# Patient Record
Sex: Male | Born: 1945 | Race: White | Hispanic: No | Marital: Married | Smoking: Never smoker
Health system: Southern US, Community
[De-identification: ages and names within clinical notes are randomized; demographics above are authoritative.]

## PROBLEM LIST (undated history)

## (undated) DIAGNOSIS — F79 Unspecified intellectual disabilities: Secondary | ICD-10-CM

---

## 2002-01-22 ENCOUNTER — Emergency Department (HOSPITAL_COMMUNITY): Admission: EM | Admit: 2002-01-22 | Discharge: 2002-01-22 | Payer: Self-pay | Admitting: Emergency Medicine

## 2005-05-12 ENCOUNTER — Emergency Department (HOSPITAL_COMMUNITY): Admission: EM | Admit: 2005-05-12 | Discharge: 2005-05-13 | Payer: Self-pay | Admitting: Emergency Medicine

## 2008-11-04 ENCOUNTER — Emergency Department (HOSPITAL_COMMUNITY): Admission: EM | Admit: 2008-11-04 | Discharge: 2008-11-04 | Payer: Self-pay | Admitting: Emergency Medicine

## 2010-07-05 ENCOUNTER — Emergency Department (HOSPITAL_COMMUNITY): Admission: EM | Admit: 2010-07-05 | Discharge: 2010-07-06 | Payer: Self-pay | Admitting: Emergency Medicine

## 2010-12-22 LAB — PROTIME-INR: Prothrombin Time: 12.8 seconds (ref 11.6–15.2)

## 2010-12-22 LAB — CBC
HCT: 39.3 % (ref 39.0–52.0)
RDW: 12.1 % (ref 11.5–15.5)
WBC: 6.9 10*3/uL (ref 4.0–10.5)

## 2010-12-22 LAB — COMPREHENSIVE METABOLIC PANEL
ALT: 27 U/L (ref 0–53)
AST: 53 U/L — ABNORMAL HIGH (ref 0–37)
Albumin: 3.7 g/dL (ref 3.5–5.2)
Alkaline Phosphatase: 74 U/L (ref 39–117)
GFR calc Af Amer: >60 mL/min (ref >60)
Glucose, Bld: 169 mg/dL — ABNORMAL HIGH (ref 70–99)
Potassium: 3.4 mEq/L — ABNORMAL LOW (ref 3.5–5.1)
Sodium: 138 mEq/L (ref 135–145)
Total Protein: 6.6 g/dL (ref 6.0–8.3)

## 2010-12-22 LAB — POCT I-STAT, CHEM 8
BUN: 15 mg/dL (ref 6–23)
Chloride: 102 mEq/L (ref 96–112)
Creatinine, Ser: 1.2 mg/dL (ref 0.4–1.5)
Potassium: 3.4 mEq/L — ABNORMAL LOW (ref 3.5–5.1)
Sodium: 138 mEq/L (ref 135–145)

## 2013-01-03 ENCOUNTER — Encounter (HOSPITAL_COMMUNITY): Payer: Self-pay | Admitting: Emergency Medicine

## 2013-01-03 ENCOUNTER — Emergency Department (HOSPITAL_COMMUNITY): Payer: Medicare Other

## 2013-01-03 ENCOUNTER — Emergency Department (HOSPITAL_COMMUNITY)
Admission: EM | Admit: 2013-01-03 | Discharge: 2013-01-03 | Disposition: A | Discharge status: Home / Self Care | Payer: Medicare Other | Attending: Emergency Medicine | Admitting: Emergency Medicine

## 2013-01-03 DIAGNOSIS — R4789 Other speech disturbances: Secondary | ICD-10-CM | POA: Insufficient documentation

## 2013-01-03 DIAGNOSIS — Z79899 Other long term (current) drug therapy: Secondary | ICD-10-CM | POA: Insufficient documentation

## 2013-01-03 DIAGNOSIS — R51 Headache: Secondary | ICD-10-CM | POA: Insufficient documentation

## 2013-01-03 DIAGNOSIS — F29 Unspecified psychosis not due to a substance or known physiological condition: Secondary | ICD-10-CM | POA: Insufficient documentation

## 2013-01-03 LAB — BASIC METABOLIC PANEL
BUN: 19 mg/dL (ref 6–23)
Chloride: 102 mEq/L (ref 96–112)
Glucose, Bld: 117 mg/dL — ABNORMAL HIGH (ref 70–99)
Potassium: 4.2 mEq/L (ref 3.5–5.1)
Sodium: 138 mEq/L (ref 135–145)

## 2013-01-03 LAB — CBC WITH DIFFERENTIAL/PLATELET
Basophils Relative: 1 % (ref 0–1)
Eosinophils Absolute: 0 10*3/uL (ref 0.0–0.7)
HCT: 40.5 % (ref 39.0–52.0)
Hemoglobin: 13.6 g/dL (ref 13.0–17.0)
Lymphs Abs: 0.8 10*3/uL (ref 0.7–4.0)
MCH: 31 pg (ref 26.0–34.0)
MCHC: 33.6 g/dL (ref 30.0–36.0)
Monocytes Absolute: 0.7 10*3/uL (ref 0.1–1.0)
Monocytes Relative: 9 % (ref 3–12)
Neutro Abs: 6.5 10*3/uL (ref 1.7–7.7)
RBC: 4.39 MIL/uL (ref 4.22–5.81)

## 2013-01-03 LAB — PROTIME-INR: INR: 0.94 (ref 0.00–1.49)

## 2013-01-03 MED ORDER — FENTANYL CITRATE 0.05 MG/ML IJ SOLN
100.0000 ug | Freq: Once | INTRAMUSCULAR | Status: DC
  Filled 2013-01-03: qty 2

## 2013-01-03 MED ORDER — ONDANSETRON HCL 4 MG/2ML IJ SOLN
4.0000 mg | Freq: Once | INTRAMUSCULAR | Status: AC
  Administered 2013-01-03: 4 mg via INTRAVENOUS
  Filled 2013-01-03: qty 2

## 2013-01-03 MED ORDER — KETOROLAC TROMETHAMINE 30 MG/ML IJ SOLN
30.0000 mg | Freq: Once | INTRAMUSCULAR | Status: AC
  Administered 2013-01-03: 30 mg via INTRAVENOUS
  Filled 2013-01-03: qty 1

## 2013-01-03 MED ORDER — FENTANYL CITRATE 0.05 MG/ML IJ SOLN
50.0000 ug | Freq: Once | INTRAMUSCULAR | Status: AC
  Administered 2013-01-03: 50 ug via INTRAVENOUS

## 2013-01-03 NOTE — ED Notes (Signed)
Pt here for possible TIA, sts cold left side, and head pain, trouble speaking and confusion per son. Pt couldn't call 911 until he got home.

## 2013-01-03 NOTE — ED Notes (Signed)
Patient being transported to ct at this time.

## 2013-01-03 NOTE — ED Notes (Signed)
Pt here with c/o headache and HTN onset 1000 this morning. Initial BP 202/112 then 161/ 80, CBG 136, O2 98% on 2L. EMS given 250 mcg. Pt states has some relief.

## 2013-01-03 NOTE — ED Notes (Signed)
Pt desat to 85%, placed on 2L [address], O2 97%.

## 2013-01-03 NOTE — ED Notes (Signed)
Patient transported to CT 

## 2013-01-06 NOTE — ED Provider Notes (Signed)
History     CSN: [id number]  Arrival date & time 01/03/13  1330   First MD Initiated Contact with Patient 01/03/13 1414      Chief Complaint  Patient presents with  . Transient Ischemic Attack     HPI Pt here for possible TIA, sts cold left side, and head pain, trouble speaking and confusion per son. Pt couldn't call 911 until he got home.  History reviewed. No pertinent past medical history.  History reviewed. No pertinent past surgical history.  Family History  Problem Relation Age of Onset  . Hypertension Mother   . Diabetes Mother   . Hypertension Father   . Diabetes Father   . Seizures Brother     History  Substance Use Topics  . Smoking status: Never Smoker   . Smokeless tobacco: Not on file  . Alcohol Use: No      Review of Systems  Allergies  Review of patient's allergies indicates no known allergies.  Home Medications   Current Outpatient Rx  Name  Route  Sig  Dispense  Refill  . omeprazole (PRILOSEC OTC) 20 MG tablet   Oral   Take 20 mg by mouth daily.           BP 122/61  Pulse 57  Temp(Src) 98.5 F (36.9 C) (Oral)  Resp 18  SpO2 99%  Physical Exam  Nursing note and vitals reviewed. Constitutional: He is oriented to person, place, and time. He appears well-developed and well-nourished. No distress.  HENT:  Head: Normocephalic and atraumatic.  Eyes: Pupils are equal, round, and reactive to light.  Neck: Normal range of motion.  Cardiovascular: Normal rate and intact distal pulses.   Pulmonary/Chest: No respiratory distress.  Abdominal: Normal appearance. He exhibits no distension.  Musculoskeletal: Normal range of motion.  Neurological: He is alert and oriented to person, place, and time. He has normal strength. No cranial nerve deficit. Coordination normal. GCS eye subscore is 4. GCS verbal subscore is 5. GCS motor subscore is 6.  Skin: Skin is warm and dry. No rash noted.  Psychiatric: He has a normal mood and affect. His  behavior is normal.    ED Course  Procedures (including critical care time)  Labs Reviewed  BASIC METABOLIC PANEL - Abnormal; Notable for the following:    Glucose, Bld 117 (*)    GFR calc non Af Amer 58 (*)    GFR calc Af Amer 67 (*)    All other components within normal limits  CBC WITH DIFFERENTIAL - Abnormal; Notable for the following:    Neutrophils Relative 81 (*)    Lymphocytes Relative 10 (*)    All other components within normal limits  PROTIME-INR   No results found.  *RADIOLOGY REPORT*  Clinical Data: TIA. Headache.  CT HEAD WITHOUT CONTRAST  Technique: Contiguous axial images were obtained from the base of the skull through the vertex without contrast.  Comparison: CT 07/05/2010  Findings: Ventricle size is normal. Mild chronic microvascular ischemia in the white matter. No acute infarct. Negative for hemorrhage or mass. Calvarium is intact.  Small air-fluid level left maxillary sinus.  IMPRESSION: Mild chronic microvascular ischemia in the white matter. No acute intracranial abnormality.    1. Headache       MDM  Patient seems to be sing better.  According to family he has done this type of thing before.  They want to take him home. Patient back to baseline.  Without headache  Nelia Shi, MD 01/06/13 [phone]

## 2014-04-11 ENCOUNTER — Emergency Department (HOSPITAL_COMMUNITY): Payer: Medicare Other

## 2014-04-11 ENCOUNTER — Encounter (HOSPITAL_COMMUNITY): Payer: Self-pay | Admitting: Emergency Medicine

## 2014-04-11 ENCOUNTER — Inpatient Hospital Stay (HOSPITAL_COMMUNITY)
Admission: EM | Admit: 2014-04-11 | Discharge: 2014-05-09 | DRG: 064 | Disposition: E | Payer: Medicare Other | Attending: Pulmonary Disease | Admitting: Pulmonary Disease

## 2014-04-11 DIAGNOSIS — M6282 Rhabdomyolysis: Secondary | ICD-10-CM | POA: Diagnosis present

## 2014-04-11 DIAGNOSIS — N179 Acute kidney failure, unspecified: Secondary | ICD-10-CM | POA: Diagnosis present

## 2014-04-11 DIAGNOSIS — I6312 Cerebral infarction due to embolism of basilar artery: Secondary | ICD-10-CM

## 2014-04-11 DIAGNOSIS — R471 Dysarthria and anarthria: Secondary | ICD-10-CM | POA: Diagnosis present

## 2014-04-11 DIAGNOSIS — E872 Acidosis, unspecified: Secondary | ICD-10-CM | POA: Diagnosis present

## 2014-04-11 DIAGNOSIS — Z66 Do not resuscitate: Secondary | ICD-10-CM

## 2014-04-11 DIAGNOSIS — I6350 Cerebral infarction due to unspecified occlusion or stenosis of unspecified cerebral artery: Secondary | ICD-10-CM | POA: Diagnosis present

## 2014-04-11 DIAGNOSIS — L0201 Cutaneous abscess of face: Secondary | ICD-10-CM | POA: Diagnosis present

## 2014-04-11 DIAGNOSIS — L8990 Pressure ulcer of unspecified site, unspecified stage: Secondary | ICD-10-CM | POA: Diagnosis present

## 2014-04-11 DIAGNOSIS — F79 Unspecified intellectual disabilities: Secondary | ICD-10-CM | POA: Diagnosis present

## 2014-04-11 DIAGNOSIS — Z515 Encounter for palliative care: Secondary | ICD-10-CM | POA: Diagnosis not present

## 2014-04-11 DIAGNOSIS — L03211 Cellulitis of face: Secondary | ICD-10-CM

## 2014-04-11 DIAGNOSIS — I618 Other nontraumatic intracerebral hemorrhage: Secondary | ICD-10-CM

## 2014-04-11 DIAGNOSIS — L89899 Pressure ulcer of other site, unspecified stage: Secondary | ICD-10-CM | POA: Diagnosis present

## 2014-04-11 DIAGNOSIS — R404 Transient alteration of awareness: Secondary | ICD-10-CM | POA: Diagnosis present

## 2014-04-11 DIAGNOSIS — G9340 Encephalopathy, unspecified: Secondary | ICD-10-CM | POA: Diagnosis present

## 2014-04-11 DIAGNOSIS — I619 Nontraumatic intracerebral hemorrhage, unspecified: Secondary | ICD-10-CM | POA: Diagnosis present

## 2014-04-11 DIAGNOSIS — J9600 Acute respiratory failure, unspecified whether with hypoxia or hypercapnia: Secondary | ICD-10-CM | POA: Diagnosis present

## 2014-04-11 DIAGNOSIS — Z79899 Other long term (current) drug therapy: Secondary | ICD-10-CM | POA: Diagnosis not present

## 2014-04-11 DIAGNOSIS — I6322 Cerebral infarction due to unspecified occlusion or stenosis of basilar arteries: Secondary | ICD-10-CM

## 2014-04-11 DIAGNOSIS — R4189 Other symptoms and signs involving cognitive functions and awareness: Secondary | ICD-10-CM

## 2014-04-11 DIAGNOSIS — I214 Non-ST elevation (NSTEMI) myocardial infarction: Secondary | ICD-10-CM | POA: Diagnosis present

## 2014-04-11 DIAGNOSIS — K7200 Acute and subacute hepatic failure without coma: Secondary | ICD-10-CM | POA: Diagnosis present

## 2014-04-11 DIAGNOSIS — I613 Nontraumatic intracerebral hemorrhage in brain stem: Secondary | ICD-10-CM

## 2014-04-11 DIAGNOSIS — L0390 Cellulitis, unspecified: Secondary | ICD-10-CM | POA: Diagnosis present

## 2014-04-11 DIAGNOSIS — J9601 Acute respiratory failure with hypoxia: Secondary | ICD-10-CM

## 2014-04-11 DIAGNOSIS — E869 Volume depletion, unspecified: Secondary | ICD-10-CM | POA: Diagnosis present

## 2014-04-11 DIAGNOSIS — I639 Cerebral infarction, unspecified: Secondary | ICD-10-CM | POA: Diagnosis present

## 2014-04-11 DIAGNOSIS — I4891 Unspecified atrial fibrillation: Secondary | ICD-10-CM | POA: Diagnosis present

## 2014-04-11 DIAGNOSIS — IMO0002 Reserved for concepts with insufficient information to code with codable children: Secondary | ICD-10-CM

## 2014-04-11 HISTORY — DX: Unspecified intellectual disabilities: F79

## 2014-04-11 LAB — URINALYSIS, ROUTINE W REFLEX MICROSCOPIC
Bilirubin Urine: NEGATIVE
GLUCOSE, UA: NEGATIVE mg/dL
Ketones, ur: 15 mg/dL — AB
LEUKOCYTES UA: NEGATIVE
Nitrite: NEGATIVE
Protein, ur: 100 mg/dL — AB
SPECIFIC GRAVITY, URINE: 1.026 (ref 1.005–1.030)
Urobilinogen, UA: 0.2 mg/dL (ref 0.0–1.0)
pH: 5 (ref 5.0–8.0)

## 2014-04-11 LAB — CBC WITH DIFFERENTIAL/PLATELET
BASOS ABS: 0 10*3/uL (ref 0.0–0.1)
Basophils Relative: 0 % (ref 0–1)
EOS ABS: 0 10*3/uL (ref 0.0–0.7)
EOS PCT: 0 % (ref 0–5)
HCT: 47.5 % (ref 39.0–52.0)
Hemoglobin: 16.4 g/dL (ref 13.0–17.0)
Lymphocytes Relative: 13 % (ref 12–46)
Lymphs Abs: 1.4 10*3/uL (ref 0.7–4.0)
MCH: 32.8 pg (ref 26.0–34.0)
MCHC: 34.5 g/dL (ref 30.0–36.0)
MCV: 95 fL (ref 78.0–100.0)
Monocytes Absolute: 1.6 10*3/uL — ABNORMAL HIGH (ref 0.1–1.0)
Monocytes Relative: 15 % — ABNORMAL HIGH (ref 3–12)
NEUTROS PCT: 72 % (ref 43–77)
Neutro Abs: 7.8 10*3/uL — ABNORMAL HIGH (ref 1.7–7.7)
PLATELETS: 203 10*3/uL (ref 150–400)
RBC: 5 MIL/uL (ref 4.22–5.81)
RDW: 13.3 % (ref 11.5–15.5)
WBC: 10.8 10*3/uL — ABNORMAL HIGH (ref 4.0–10.5)

## 2014-04-11 LAB — COMPREHENSIVE METABOLIC PANEL
ALBUMIN: 2.7 g/dL — AB (ref 3.5–5.2)
ALT: 250 U/L — AB (ref 0–53)
AST: 696 U/L — AB (ref 0–37)
Alkaline Phosphatase: 76 U/L (ref 39–117)
Anion gap: 23 — ABNORMAL HIGH (ref 5–15)
BUN: 86 mg/dL — ABNORMAL HIGH (ref 6–23)
CALCIUM: 8.3 mg/dL — AB (ref 8.4–10.5)
CO2: 19 mEq/L (ref 19–32)
Chloride: 103 mEq/L (ref 96–112)
Creatinine, Ser: 3.03 mg/dL — ABNORMAL HIGH (ref 0.50–1.35)
GFR calc Af Amer: 23 mL/min — ABNORMAL LOW (ref >90)
GFR calc non Af Amer: 20 mL/min — ABNORMAL LOW (ref >90)
Glucose, Bld: 188 mg/dL — ABNORMAL HIGH (ref 70–99)
Potassium: 5 mEq/L (ref 3.7–5.3)
SODIUM: 145 meq/L (ref 137–147)
TOTAL PROTEIN: 6.4 g/dL (ref 6.0–8.3)
Total Bilirubin: 1.3 mg/dL — ABNORMAL HIGH (ref 0.3–1.2)

## 2014-04-11 LAB — I-STAT CG4 LACTIC ACID, ED: LACTIC ACID, VENOUS: 4.54 mmol/L — AB (ref 0.5–2.2)

## 2014-04-11 LAB — MRSA PCR SCREENING: MRSA by PCR: NEGATIVE

## 2014-04-11 LAB — CK: Total CK: 24486 U/L — ABNORMAL HIGH (ref 7–232)

## 2014-04-11 LAB — URINE MICROSCOPIC-ADD ON

## 2014-04-11 LAB — TROPONIN I: TROPONIN I: 0.3 ng/mL — AB (ref <0.30)

## 2014-04-11 LAB — GLUCOSE, CAPILLARY: GLUCOSE-CAPILLARY: 154 mg/dL — AB (ref 70–99)

## 2014-04-11 MED ORDER — VANCOMYCIN HCL IN DEXTROSE 1-5 GM/200ML-% IV SOLN
1000.0000 mg | INTRAVENOUS | Status: DC
  Administered 2014-04-11: 1000 mg via INTRAVENOUS
  Filled 2014-04-11: qty 200

## 2014-04-11 MED ORDER — SODIUM CHLORIDE 0.9 % IV SOLN: 250.0000 mL | INTRAVENOUS | Status: DC | PRN

## 2014-04-11 MED ORDER — FENTANYL BOLUS VIA INFUSION
25.0000 ug | INTRAVENOUS | Status: DC | PRN
  Administered 2014-04-11 (×3): 50 ug via INTRAVENOUS
  Filled 2014-04-11: qty 50

## 2014-04-11 MED ORDER — SODIUM CHLORIDE 0.9 % IV BOLUS (SEPSIS)
1000.0000 mL | Freq: Once | INTRAVENOUS | Status: AC
  Administered 2014-04-11: 1000 mL via INTRAVENOUS

## 2014-04-11 MED ORDER — MORPHINE BOLUS VIA INFUSION: 5.0000 mg | INTRAVENOUS | Status: DC | PRN

## 2014-04-11 MED ORDER — MORPHINE SULFATE 4 MG/ML IJ SOLN: 4.0000 mg | INTRAMUSCULAR | Status: DC | PRN

## 2014-04-11 MED ORDER — LORAZEPAM BOLUS VIA INFUSION
2.0000 mg | INTRAVENOUS | Status: DC | PRN
  Filled 2014-04-11: qty 5

## 2014-04-11 MED ORDER — PANTOPRAZOLE SODIUM 40 MG IV SOLR
40.0000 mg | Freq: Every day | INTRAVENOUS | Status: DC
  Filled 2014-04-11: qty 40

## 2014-04-11 MED ORDER — FENTANYL CITRATE 0.05 MG/ML IJ SOLN: 50.0000 ug | Freq: Once | INTRAMUSCULAR | Status: AC

## 2014-04-11 MED ORDER — MORPHINE SULFATE 10 MG/ML IJ SOLN: 10.0000 mg/h | INTRAVENOUS | Status: DC

## 2014-04-11 MED ORDER — SODIUM CHLORIDE 0.9 % IV SOLN
0.0000 ug/h | INTRAVENOUS | Status: DC
  Administered 2014-04-11: 100 ug/h via INTRAVENOUS
  Filled 2014-04-11: qty 50

## 2014-04-11 MED ORDER — METOPROLOL TARTRATE 1 MG/ML IV SOLN: 5.0000 mg | Freq: Four times a day (QID) | INTRAVENOUS | Status: DC

## 2014-04-11 MED ORDER — DEXTROSE-NACL 5-0.45 % IV SOLN
INTRAVENOUS | Status: DC
Start: 2014-04-11 — End: 2014-04-11

## 2014-04-11 MED ORDER — SODIUM CHLORIDE 0.9 % IV SOLN
4.0000 mg/h | INTRAVENOUS | Status: DC
  Administered 2014-04-11: 4 mg/h via INTRAVENOUS
  Filled 2014-04-11: qty 10

## 2014-04-11 MED ORDER — LORAZEPAM 2 MG/ML IJ SOLN
5.0000 mg/h | INTRAMUSCULAR | Status: DC
  Filled 2014-04-11: qty 25

## 2014-04-11 NOTE — ED Notes (Signed)
Pt transported to CT with RT and this RN.   

## 2014-04-11 NOTE — Progress Notes (Signed)
Wasted 100mL Fentanyl and 80mL Morphine after patient expired.  Delsa Saleyril, RN witnessed

## 2014-04-11 NOTE — ED Notes (Addendum)
Portable xray at bedside.   RN requested sedation for patient. MD sts to hold for now.

## 2014-04-11 NOTE — ED Notes (Signed)
Dr. Reynolds at bedside.

## 2014-04-11 NOTE — Progress Notes (Signed)
ANTIBIOTIC CONSULT NOTE - INITIAL  Pharmacy Consult for vancomycin Indication: cellulitis  No Known Allergies  Patient Measurements: Wt= 57.1kg  Vital Signs: Temp: 96.8 F (36 C) (07/04 1445) Temp src: Temporal (07/04 1112) BP: 131/78 mmHg (07/04 1445) Pulse Rate: 68 (07/04 1529) Intake/Output from previous day:   Intake/Output from this shift: Total I/O In: 3000 [I.V.:3000] Out: 1000 [Urine:1000]  Labs:  Recent Labs  Jan 20, 2014 1127  WBC 10.8*  HGB 16.4  PLT 203  CREATININE 3.03*   The CrCl is unknown because both a height and weight (above a minimum accepted value) are required for this calculation. No results found for this basename: VANCOTROUGH, VANCOPEAK, VANCORANDOM, GENTTROUGH, GENTPEAK, GENTRANDOM, TOBRATROUGH, TOBRAPEAK, TOBRARND, AMIKACINPEAK, AMIKACINTROU, AMIKACIN,  in the last 72 hours   Microbiology: No results found for this or any previous visit (from the past 720 hour(s)).  Medical History: Past Medical History  Diagnosis Date  . Mental retardation     Medications:  Prescriptions prior to admission  Medication Sig Dispense Refill  . omeprazole (PRILOSEC OTC) 20 MG tablet Take 20 mg by mouth daily.       Scheduled:  . fentaNYL  50 mcg Intravenous Once  . metoprolol  5 mg Intravenous 4 times per day  . pantoprazole (PROTONIX) IV  40 mg Intravenous QHS    Assessment: 68 yo male here with stroke with hemorrhagic conversion and to begin vancomycin for facial cellulitis.  WBC= 1.8, SCr= 3.03, CrCl ~25 (SCr noted 1.28 12/2012).  7/4 vanc>>  Goal of Therapy:  Vancomycin trough level 10-15 mcg/ml  Plan:  -Vancomycin 1000mg  IV q48hr -Will follow renal function and clinical progress  Harland Germanndrew Saxton Chain, Pharm D 05/06/2014 3:41 PM

## 2014-04-11 NOTE — Progress Notes (Signed)
[facility]  Discussed situation with Mr. Kevin Herrera's family at length today.  They understand the severity of this illness and the degree of brain injury.  They understand that he will not survive this illness.  We discussed our current level of support and I recommended that we withdraw the ventilator and let him pass away peacefully on a fentanyl drip.  They agree with this plan.  Withdrawal of care order set written.  Kevin [name], MD [place] [facility] Pager: [phone] Cell: [phone] If no response, call [phone]

## 2014-04-11 NOTE — Progress Notes (Signed)
eLink Physician-Brief Progress Note Patient Name: Kevin Herrera DOB: 06/12/1946 MRN: [id number]  Date of Service  04/29/2014   HPI/Events of Note   Family ready for withdrawal of all care   eICU Interventions  See orders   Intervention Category Major Interventions: End of life / care limitation discussion  Shan Levansatrick Wright 05/08/2014, 8:02 PM

## 2014-04-11 NOTE — ED Notes (Signed)
Results of lactic acid given to Dr. Loretha [place]ord [place]

## 2014-04-11 NOTE — Progress Notes (Signed)
Pt. Extubated as per MD order and protocol. Family and RN at pt. Bedside.

## 2014-04-11 NOTE — Progress Notes (Signed)
Time of death: 2159.  Pola CornELINK MD Dr Delford FieldWright called and made aware of time of death.  [facility] called. Referral number is [id number]. Per [facility] pt is suitable for tissue donation.

## 2014-04-11 NOTE — ED Notes (Signed)
Lab called for CK add on

## 2014-04-11 NOTE — H&P (Addendum)
PULMONARY / CRITICAL CARE MEDICINE   Name: Kevin Herrera MRN: [id number] DOB: 03/30/1946    ADMISSION DATE:  04/27/2014 CONSULTATION DATE:  05/07/2014  REFERRING MD :  Nori RiisEDP, [facility] PRIMARY SERVICE: PCCM  CHIEF COMPLAINT:  Found down  BRIEF PATIENT DESCRIPTION: 68 y/o male with minimal past medical history [facility] after [facility] was found down at home.  He was found to have a midbrain stroke with hemorrhagic conversion in the ED.  SIGNIFICANT EVENTS / STUDIES:  7/4 CT head/c-spine/maxillofacial > 2.3x1.2x1.8 cm hemorrhagic infacrt in upper pons and lower midbrain, evidence for additional nonhemorrhagic infarct, multiple acute and subacute nonhemorrhagic infarcts in cerebellum and thalami, atrophy and white matter change in anterior circulation, soft tissue swelling R scalp and face, spondylosis of c-spine  LINES / TUBES: 7/4 ETT >>  CULTURES:   ANTIBIOTICS:   HISTORY OF PRESENT ILLNESS:  68 y/o male with minimal past medical history [facility] he was found down at home.  He was found to have a midbrain stroke with hemorrhagic conversion in the ED.  The patient was intubated in the ED and no further history could be obtained aside from talking to the ED providers and the patient's family.  The patient was last seen normal on Thursday July 2 afternoon when he was playing with children near his home.  No one heard from him on July 3 so family went to check on him on July 4.  He was found by his brother face down under his bed breathing spontaneously but unresponsive.  [facility] who intubated the patient in route.    PAST MEDICAL HISTORY :  Past Medical History  Diagnosis Date  . Mental retardation    No past surgical history on file. Prior to Admission medications   Medication Sig Start Date End Date Taking? Authorizing Provider  omeprazole (PRILOSEC OTC) 20 MG tablet Take 20 mg by mouth daily.    Yes Historical Provider, MD   No Known Allergies  FAMILY HISTORY/SOCIAL HISTORY/REVIEW OF SYSTEMS:  Could not obtain due to intubation  SUBJECTIVE:   VITAL SIGNS: Temp:  [95.7 F (35.4 C)-98.8 F (37.1 C)] 95.7 F (35.4 C) (07/04 1330) Pulse Rate:  [70-160] 133 (07/04 1330) Resp:  [14-28] 23 (07/04 1315) BP: (109-155)/(65-92) 131/74 mmHg (07/04 1330) SpO2:  [97 %-100 %] 99 % (07/04 1330) FiO2 (%):  [100 %] 100 % (07/04 1125) HEMODYNAMICS:   VENTILATOR SETTINGS: Vent Mode:  [-] PRVC FiO2 (%):  [100 %] 100 % Set Rate:  [16 bmp] 16 bmp Vt Set:  [530 mL] 530 mL PEEP:  [5 cmH20] 5 cmH20 Plateau Pressure:  [24 cmH20] 24 cmH20 INTAKE / OUTPUT: Intake/Output     07/03 0701 - 07/04 0700 07/04 0701 - 07/05 0700   I.V.  3000   Total Intake   3000   Urine  1000   Total Output   1000   Net   +2000          PHYSICAL EXAMINATION:  Gen: elderly white male, multiple pressure wounds, chronically ill appearing on vent HEENT: Right sided pressure wound/ulcer over R zygomatic process, R eyelid erythematous, fluctuant, Left eye pupil round, not reactive to light, ETT in place, hard collar in place PULM: rhonchi bilaterally, good air movement CV: Irreg irreg, no murmur AB: BS+, soft, nontender, no hsm Ext: warm, no edema, no clubbing, no cyanosis (see derm)  Derm: multiple skin ulcerations: large chest wall pressure ulcer, bilateral knee pressure ulcers L > R Neuro: non-arousable on vent, non-purposeful movements L arm/leg  LABS:  CBC  Recent Labs Lab November 13, 2013 1127  WBC 10.8*  HGB 16.4  HCT 47.5  PLT 203   Coag's No results found for this basename: APTT, INR,  in the last 168 hours BMET  Recent Labs Lab November 13, 2013 1127  NA 145  K 5.0  CL 103  CO2 19  BUN 86*  CREATININE 3.03*  GLUCOSE 188*   Electrolytes  Recent Labs Lab November 13, 2013 1127  CALCIUM 8.3*   Sepsis Markers  Recent Labs Lab November 13, 2013 1137  LATICACIDVEN 4.54*   ABG No results found for this  basename: PHART, PCO2ART, PO2ART,  in the last 168 hours Liver Enzymes  Recent Labs Lab November 13, 2013 1127  AST 696*  ALT 250*  ALKPHOS 76  BILITOT 1.3*  ALBUMIN 2.7*   Cardiac Enzymes  Recent Labs Lab November 13, 2013 1127  TROPONINI 0.30*   Glucose No results found for this basename: GLUCAP,  in the last 168 hours  Imaging   CXR: hyperexpanded lungs, ETT in place  ASSESSMENT / PLAN:  NEUROLOGIC A:   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 Acute encephalopathy due to the stroke, very poor prognosis P:   RASS goal: -1 -per neurology  PULMONARY A: Acute respiratory failure due to inability to protect airway from CVA P:   -full vent support for now -ABG -CXR in AM  CARDIOVASCULAR A: Afib with RVR NSTEMI P:  -start with metoprolol for rate control -tele -hold anticoagulation with head bleed -no ASA given with head  RENAL A:  AKI from volume depletion Lactic acidosis from prolonged down time P:   -D5 with 1/2 NS -monitor BMET  GASTROINTESTINAL A:  Shock Liver P:   -monitor LFTs -Pantoprazole for stress ulcer prophylaxis  HEMATOLOGIC A:  No acute issues P:  -SCD's for DVT prophylaxis  INFECTIOUS A:  Cellulitis on face P:   -vanc -wound care  ENDOCRINE A:  No acute issues P:   -monitor glucose   Code status: DNR, will address withdrawal with family when all family here. This is a non-survivable stroke and suspect family wants to withdraw care.   I have personally obtained a history, examined the patient, evaluated laboratory and imaging results, formulated the assessment and plan and placed orders. CRITICAL CARE: The patient is critically ill with multiple organ systems failure and requires high complexity decision making for assessment and support, frequent evaluation and titration of therapies, application of advanced monitoring technologies and extensive interpretation of multiple databases. Critical Care Time devoted to  patient care services described in this note is 45 minutes.   Heber CarolinaBrent Taqwa Deem, MD [place] [facility] Pager: [phone] Cell: [phone] If no response, call [phone]   04/11/2014, 2:15 PM

## 2014-04-11 NOTE — ED Notes (Signed)
Chaplain notified 

## 2014-04-11 NOTE — Consult Note (Signed)
Referring Physician: Loretha Stapler    Chief Complaint: Unresponsive  HPI: Kevin Herrera is an 68 y.o. male who lives alone and was last seen by neighbors on Thursday.  Did not speak to family members yesterday and today when his nephew found him he was on the ground unclothed.  EMS was called at that time and the patient was brought to the ED for evaluation.  Patient was intubated in the ED.   At baseline patient mentally challenged but able to live alone and work.  Was unable to read and write.  Speech was markedly dysarthric.    Date last known well: Date: 04/09/2014 Time last known well: Unable to determine tPA Given: No: Hemorrhage noted on imaging, outside time window  Past Medical History  Diagnosis Date  . Mental retardation     No past surgical history on file.  Family History  Problem Relation Age of Onset  . Hypertension Mother   . Diabetes Mother   . Hypertension Father   . Diabetes Father   . Seizures Brother    Social History:  reports that he has never smoked. He does not have any smokeless tobacco history on file. He reports that he does not drink alcohol. His drug history is not on file.  Allergies: No Known Allergies  Medications: I have reviewed the patient's current medications. Prior to Admission:  Current outpatient prescriptions:omeprazole (PRILOSEC OTC) 20 MG tablet, Take 20 mg by mouth daily., Disp: , Rfl:   ROS: Unable to provide due to mental status  Physical Examination: Blood pressure 128/82, pulse 76, temperature 96.6 F (35.9 C), temperature source Temporal, resp. rate 24, height 5\' 7"  (1.702 m), SpO2 97.00%.  Neurologic Examination: Mental Status: Patient does not respond to verbal stimuli.  Does not respond to deep sternal rub.  Does not follow commands.  No verbalizations noted.  Cranial Nerves: II: patient does not respond confrontation bilaterally, left pupil 4mm and unreactive, right pupil unable to be visualized (bruising around the eye  and puss coming from eye on manipulation III,IV,VI: doll's response absent on the left.  V,VII: corneal reflex absent on the left  VIII: patient does not respond to verbal stimuli IX,X: gag reflex reduced, XI: trapezius strength unable to test bilaterally XII: tongue strength unable to test Motor: Patient has spontaneous movement of the left upper extremity and left lower extremity as well as movement in these extremities with stimulation.  Increased tone in the RUE with some minimal withdrawal to noxious stimuli.  No movement noted in the RLE.   Sensory: Some triple reflex responses noted with noxious stimuli in the extremities.  Deep Tendon Reflexes:  1+ in the upper extremities.  Unable to perform at the knees due to skin/muscle breakdown.  Absent AJ's bilaterally Plantars: mute bilaterally Cerebellar: Unable to perform    Laboratory Studies:  Basic Metabolic Panel:  Recent Labs Lab 2014-04-23 1127  NA 145  K 5.0  CL 103  CO2 19  GLUCOSE 188*  BUN 86*  CREATININE 3.03*  CALCIUM 8.3*    Liver Function Tests:  Recent Labs Lab 23-Apr-2014 1127  AST 696*  ALT 250*  ALKPHOS 76  BILITOT 1.3*  PROT 6.4  ALBUMIN 2.7*   No results found for this basename: LIPASE, AMYLASE,  in the last 168 hours No results found for this basename: AMMONIA,  in the last 168 hours  CBC:  Recent Labs Lab 23-Apr-2014 1127  WBC 10.8*  NEUTROABS 7.8*  HGB 16.4  HCT 47.5  MCV 95.0  PLT 203    Cardiac Enzymes:  Recent Labs Lab 05/06/2014 1127  TROPONINI 0.30*    BNP: No components found with this basename: POCBNP,   CBG: No results found for this basename: GLUCAP,  in the last 168 hours  Microbiology: No results found for this or any previous visit.  Coagulation Studies: No results found for this basename: LABPROT, INR,  in the last 72 hours  Urinalysis:  Recent Labs Lab 05/08/2014 1137  COLORURINE AMBER*  LABSPEC 1.026  PHURINE 5.0  GLUCOSEU NEGATIVE  HGBUR LARGE*   BILIRUBINUR NEGATIVE  KETONESUR 15*  PROTEINUR 100*  UROBILINOGEN 0.2  NITRITE NEGATIVE  LEUKOCYTESUR NEGATIVE    Lipid Panel: No results found for this basename: chol, trig, hdl, cholhdl, vldl, ldlcalc    HgbA1C:  No results found for this basename: HGBA1C    Urine Drug Screen:   No results found for this basename: labopia, cocainscrnur, labbenz, amphetmu, thcu, labbarb    Alcohol Level: No results found for this basename: ETH,  in the last 168 hours  Other results: EKG: tachycardia at 166 bpm.  Imaging: Ct Head Wo Contrast  04/20/2014   CLINICAL DATA:  Patient found down unresponsive. Contusion to back of head. Lasting normal several days ago.  EXAM: CT HEAD WITHOUT CONTRAST  CT MAXILLOFACIAL WITHOUT CONTRAST  CT CERVICAL SPINE WITHOUT CONTRAST  TECHNIQUE: Multidetector CT imaging of the head, cervical spine, and maxillofacial structures were performed using the standard protocol without intravenous contrast. Multiplanar CT image reconstructions of the cervical spine and maxillofacial structures were also generated.  COMPARISON:  CT head without contrast 01/03/2013. CT cervical spine 07/05/2010  FINDINGS: CT HEAD FINDINGS  Patient's intubated. A brainstem hemorrhage is evident within the pons posteriorly and to the right measuring at least 2.3 x 1.2 x 1.8cm. mm there is marked hypoattenuation in the midbrain above the hemorrhage suggesting infarct. Multiple patchy areas of hypoattenuation are present within the cerebellar hemispheres bilaterally, more prominent left than right. No additional hemorrhage is present. 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  A fluid level is present in the right frontal sinus. There scattered opacification of ethmoid air cells. Circumferential mucosal thickening in some fluid is present  in the right maxillary sinus. Mild mucosal thickening is present in the right sphenoid sinus. There is fluid in the left mastoid air cells. The right mastoid air cells are clear. Extensive edema is present over the right side of the head including periorbital edema. This extends into the face.  CT MAXILLOFACIAL FINDINGS  Extensive soft tissue swelling is present over the right side of skull and face. There is no discrete abscess. Asymmetric mucosal disease in fluid is present within the paranasal sinuses. The patient is intubated. Extensive periorbital edema is present. The globe and orbit are intact.  Patient is edentulous.  The mandible is intact and located.  CT CERVICAL SPINE FINDINGS  The cervical spine is imaged from the skullbase through T3. There is significant loss of disc height and endplate osteophyte formation throughout the cervical spine. This is generally worse on the right. No acute fracture or traumatic subluxation is evident. The lung apices are clear. The soft tissues of the neck are unremarkable. The endotracheal tube is in satisfactory position.  IMPRESSION: 1. 2.3 x 1.2 x 1.8 cm hemorrhagic infarct within the upper pons and lower midbrain. 2. Extensive  hypoattenuation in the midbrain superior to this area compatible with edema or additional nonhemorrhagic infarct. 3. Multiple acute and subacute nonhemorrhagic infarcts involving the cerebellum and thalami bilaterally as described. These represent multiple vascular lesions within the posterior circulation territory. 4. Atrophy and white matter disease within the anterior circulation territories is within normal limits for age. 5. Extensive soft tissue swelling over the right side of the scalp and face. There is no underlying abscess or osseous lesion. This may represent positional edema if the patient was down for an extended. 6. Moderate spondylosis of the cervical spine. No acute abnormality of spine. Critical Value/emergent results were called  by telephone at the time of interpretation on 04/22/2014 at 12:24 PM to Dr. Blake DivineJOHN WOFFORD , who verbally acknowledged these results.   Electronically Signed   By: Gennette Pachris  Mattern M.D.   On: 04/30/2014 12:30   Ct Cervical Spine Wo Contrast  04/15/2014   CLINICAL DATA:  Patient found down unresponsive. Contusion to back of head. Lasting normal several days ago.  EXAM: CT HEAD WITHOUT CONTRAST  CT MAXILLOFACIAL WITHOUT CONTRAST  CT CERVICAL SPINE WITHOUT CONTRAST  TECHNIQUE: Multidetector CT imaging of the head, cervical spine, and maxillofacial structures were performed using the standard protocol without intravenous contrast. Multiplanar CT image reconstructions of the cervical spine and maxillofacial structures were also generated.  COMPARISON:  CT head without contrast 01/03/2013. CT cervical spine 07/05/2010  FINDINGS: CT HEAD FINDINGS  Patient's intubated. A brainstem hemorrhage is evident within the pons posteriorly and to the right measuring at least 2.3 x 1.2 x 1.8cm. mm there is marked hypoattenuation in the midbrain above the hemorrhage suggesting infarct. Multiple patchy areas of hypoattenuation are present within the cerebellar hemispheres bilaterally, more prominent left than right. No additional hemorrhage is present. 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  A fluid level is present in the right frontal sinus. There scattered opacification of ethmoid air cells. Circumferential mucosal thickening in some fluid is present in the right maxillary sinus. Mild mucosal thickening is present in the right sphenoid sinus. There is fluid in the left mastoid air cells. The right mastoid air cells are clear. Extensive edema is present over the right side of the head including periorbital edema. This extends into the face.  CT MAXILLOFACIAL FINDINGS   Extensive soft tissue swelling is present over the right side of skull and face. There is no discrete abscess. Asymmetric mucosal disease in fluid is present within the paranasal sinuses. The patient is intubated. Extensive periorbital edema is present. The globe and orbit are intact.  Patient is edentulous.  The mandible is intact and located.  CT CERVICAL SPINE FINDINGS  The cervical spine is imaged from the skullbase through T3. There is significant loss of disc height and endplate osteophyte formation throughout the cervical spine. This is generally worse on the right. No acute fracture or traumatic subluxation is evident. The lung apices are clear. The soft tissues of the neck are unremarkable. The endotracheal tube is in satisfactory position.  IMPRESSION: 1. 2.3 x 1.2 x 1.8 cm hemorrhagic infarct within the upper pons and lower midbrain. 2. Extensive hypoattenuation in the midbrain superior to this area compatible with edema or additional nonhemorrhagic infarct. 3. Multiple acute and subacute nonhemorrhagic infarcts involving the cerebellum and thalami bilaterally as described. These represent multiple vascular lesions within the posterior circulation territory. 4. Atrophy  and white matter disease within the anterior circulation territories is within normal limits for age. 5. Extensive soft tissue swelling over the right side of the scalp and face. There is no underlying abscess or osseous lesion. This may represent positional edema if the patient was down for an extended. 6. Moderate spondylosis of the cervical spine. No acute abnormality of spine. Critical Value/emergent results were called by telephone at the time of interpretation on 04/29/2014 at 12:24 PM to Dr. Blake Divine , who verbally acknowledged these results.   Electronically Signed   By: Gennette Pac M.D.   On: 04/14/2014 12:30   Dg Chest Port 1 View  04/26/2014   CLINICAL DATA:  unresponsive  EXAM: PORTABLE CHEST - 1 VIEW  COMPARISON:  CT  Chest abdomen pelvis 07/05/2010.  FINDINGS: The heart size and mediastinal contours are within normal limits. 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 Both lungs are otherwise clear. The lungs are hyperinflated. The visualized skeletal structures are unremarkable.  IMPRESSION: COPD.  No acute cardiopulmonary disease.   Electronically Signed   By: Salome Holmes M.D.   On: 04/21/2014 12:18   Ct Maxillofacial Wo Cm  04/11/2014   CLINICAL DATA:  Patient found down unresponsive. Contusion to back of head. Lasting normal several days ago.  EXAM: CT HEAD WITHOUT CONTRAST  CT MAXILLOFACIAL WITHOUT CONTRAST  CT CERVICAL SPINE WITHOUT CONTRAST  TECHNIQUE: Multidetector CT imaging of the head, cervical spine, and maxillofacial structures were performed using the standard protocol without intravenous contrast. Multiplanar CT image reconstructions of the cervical spine and maxillofacial structures were also generated.  COMPARISON:  CT head without contrast 01/03/2013. CT cervical spine 07/05/2010  FINDINGS: CT HEAD FINDINGS  Patient's intubated. A brainstem hemorrhage is evident within the pons posteriorly and to the right measuring at least 2.3 x 1.2 x 1.8cm. mm there is marked hypoattenuation in the midbrain above the hemorrhage suggesting infarct. Multiple patchy areas of hypoattenuation are present within the cerebellar hemispheres bilaterally, more prominent left than right. No additional hemorrhage is present. 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  A fluid level is present in the right frontal sinus. There scattered opacification of ethmoid air cells. Circumferential mucosal thickening in some fluid is present in the right maxillary sinus. Mild mucosal thickening is  present in the right sphenoid sinus. There is fluid in the left mastoid air cells. The right mastoid air cells are clear. Extensive edema is present over the right side of the head including periorbital edema. This extends into the face.  CT MAXILLOFACIAL FINDINGS  Extensive soft tissue swelling is present over the right side of skull and face. There is no discrete abscess. Asymmetric mucosal disease in fluid is present within the paranasal sinuses. The patient is intubated. Extensive periorbital edema is present. The globe and orbit are intact.  Patient is edentulous.  The mandible is intact and located.  CT CERVICAL SPINE FINDINGS  The cervical spine is imaged from the skullbase through T3. There is significant loss of disc height and endplate osteophyte formation throughout the cervical spine. This is generally worse on the right. No acute fracture or traumatic subluxation is evident. The lung apices are clear. The soft tissues of the neck are unremarkable. The endotracheal tube is in satisfactory position.  IMPRESSION: 1. 2.3 x  1.2 x 1.8 cm hemorrhagic infarct within the upper pons and lower midbrain. 2. Extensive hypoattenuation in the midbrain superior to this area compatible with edema or additional nonhemorrhagic infarct. 3. Multiple acute and subacute nonhemorrhagic infarcts involving the cerebellum and thalami bilaterally as described. These represent multiple vascular lesions within the posterior circulation territory. 4. Atrophy and white matter disease within the anterior circulation territories is within normal limits for age. 5. Extensive soft tissue swelling over the right side of the scalp and face. There is no underlying abscess or osseous lesion. This may represent positional edema if the patient was down for an extended. 6. Moderate spondylosis of the cervical spine. No acute abnormality of spine. Critical Value/emergent results were called by telephone at the time of interpretation on 04/27/2014  at 12:24 PM to Dr. Blake DivineJOHN WOFFORD , who verbally acknowledged these results.   Electronically Signed   By: Gennette Pachris  Mattern M.D.   On: 04/10/2014 12:30    Assessment: 68 y.o. male presenting after being found down.  Patient intubated in the ED.  Head CT reviewed and shows a pontine/midbrain hemorrhagic infarct with areas of hypodensity also noted in the midbrain, cerebellum bilaterally and thalami bilaterally.  These likely represent acute infarcts.  Unfortunately, this distribution of infarcts suggests a very poor prognosis for recovery of function.  This was expressed to the family.  They have agreed to patient being DNR.    Stroke Risk Factors - none  Plan: 1. No anticoagulation at this time 2. Will have further conversations about possibility of extubation once all family members arrive.  To continue with supportive care until that time.   3. Neuro checks 4. Telemetry monitoring 5.  BP control with parameters of less than 160/90  This patient is critically ill and at significant risk of neurological worsening, death and care requires constant monitoring of vital signs, hemodynamics,respiratory and cardiac monitoring, neurological assessment, discussion with family, other specialists and medical decision making of high complexity. I spent 50 minutes of neurocritical care time  in the care of  this patient.   Thana FarrLeslie Adlai Nieblas, MD [facility] [phone] 05/08/2014, 12:45 PM

## 2014-04-11 NOTE — ED Notes (Signed)
Lactic acid results given to Dr. Wofford 

## 2014-04-11 NOTE — ED Notes (Addendum)
Pt moving left leg and right arm mildly. Family ate bedside- updated on plan of care. RN requesting sedation. Dr. Loretha Staplerwofford gave verbal for soft restraint. RN held restraints at present time.

## 2014-04-11 NOTE — ED Provider Notes (Signed)
CSN: [id number]     Arrival date & time 04/25/2014  1113 History   First MD Initiated Contact with Patient 04/17/2014 1124     Chief Complaint  Patient presents with  . unresponsive      (Consider location/radiation/quality/duration/timing/severity/associated sxs/prior Treatment) HPI Comments: 68 year old male presenting via EMS due to to unresponsiveness. Her EMS, family last spoke with him 3 days ago and found him unresponsive. On EMS arrival, he was hypotensive with a heart rate near 200 beats per minute.  The residence was covered in dark-colored vomit. Patient was intubated at the scene and was noted to have what appeared to be blood in his oropharynx. Remainder of history unobtainable secondary to patient's altered mental status. Level V caveats applies.   Past Medical History  Diagnosis Date  . Mental retardation    No past surgical history on file. Family History  Problem Relation Age of Onset  . Hypertension Mother   . Diabetes Mother   . Hypertension Father   . Diabetes Father   . Seizures Brother    History  Substance Use Topics  . Smoking status: Never Smoker   . Smokeless tobacco: Not on file  . Alcohol Use: No    Review of Systems  Unable to perform ROS     Allergies  Review of patient's allergies indicates no known allergies.  Home Medications   Prior to Admission medications   Medication Sig Start Date End Date Taking? Authorizing Provider  omeprazole (PRILOSEC OTC) 20 MG tablet Take 20 mg by mouth daily.    Historical Provider, MD   BP 123/70  Pulse 160  Temp(Src) 97.7 F (36.5 C) (Temporal)  Resp 14  SpO2 98% Physical Exam  Nursing note and vitals reviewed. Constitutional: He is oriented to person, place, and time. He appears well-developed. He is intubated.  Thin  HENT:  Head: Normocephalic.  ET tube in place  Eyes: Right eye exhibits chemosis, discharge and exudate. Left eye exhibits no chemosis, no discharge and no exudate. Right  conjunctiva is injected. Left conjunctiva is not injected. Right pupil is not reactive. Left pupil is not reactive. Pupils are equal.  Neck: Neck supple.  Cardiovascular: Normal heart sounds and intact distal pulses.  An irregularly irregular rhythm present. Tachycardia present.   No murmur heard. Pulmonary/Chest: Breath sounds normal. No stridor. Tachypnea noted. He is intubated. No respiratory distress. He has no wheezes. He has no rales.  Abdominal: Soft. He exhibits no distension. There is no tenderness.  Musculoskeletal: Normal range of motion. He exhibits no edema.  Neurological: He is alert and oriented to person, place, and time. GCS eye subscore is 1. GCS verbal subscore is 1. GCS motor subscore is 1.  Some spontaneous movement of left leg, otherwise no movement of extremities  Skin: Skin is warm and dry. No rash noted.  Multiple large ulcerative lesions located on right side of face, right anterior chest wall, right hip  Psychiatric:  Unable to test    ED Course  CRITICAL CARE Performed by: Blake Divine DAVID III Authorized by: Blake Divine DAVID III Total critical care time: 35 minutes Critical care time was exclusive of separately billable procedures and treating other patients. Critical care was necessary to treat or prevent imminent or life-threatening deterioration of the following conditions: CNS failure or compromise. Critical care was time spent personally by me on the following activities: development of treatment plan with patient or surrogate, discussions with consultants, evaluation of patient's response to treatment, examination of patient,  obtaining history from patient or surrogate, ordering and performing treatments and interventions, ordering and review of laboratory studies, ordering and review of radiographic studies, pulse oximetry, re-evaluation of patient's condition and review of old charts.   (including critical care time) Labs Review Labs Reviewed - No  data to display  Imaging Review Ct Head Wo Contrast  05/08/2014   CLINICAL DATA:  Patient found down unresponsive. Contusion to back of head. Lasting normal several days ago.  EXAM: CT HEAD WITHOUT CONTRAST  CT MAXILLOFACIAL WITHOUT CONTRAST  CT CERVICAL SPINE WITHOUT CONTRAST  TECHNIQUE: Multidetector CT imaging of the head, cervical spine, and maxillofacial structures were performed using the standard protocol without intravenous contrast. Multiplanar CT image reconstructions of the cervical spine and maxillofacial structures were also generated.  COMPARISON:  CT head without contrast 01/03/2013. CT cervical spine 07/05/2010  FINDINGS: CT HEAD FINDINGS  Patient's intubated. A brainstem hemorrhage is evident within the pons posteriorly and to the right measuring at least 2.3 x 1.2 x 1.8cm. mm there is marked hypoattenuation in the midbrain above the hemorrhage suggesting infarct. Multiple patchy areas of hypoattenuation are present within the cerebellar hemispheres bilaterally, more prominent left than right. No additional hemorrhage is present. 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  A fluid level is present in the right frontal sinus. There scattered opacification of ethmoid air cells. Circumferential mucosal thickening in some fluid is present in the right maxillary sinus. Mild mucosal thickening is present in the right sphenoid sinus. There is fluid in the left mastoid air cells. The right mastoid air cells are clear. Extensive edema is present over the right side of the head including periorbital edema. This extends into the face.  CT MAXILLOFACIAL FINDINGS  Extensive soft tissue swelling is present over the right side of skull and face. There is no discrete abscess. Asymmetric mucosal disease in fluid is present within the paranasal  sinuses. The patient is intubated. Extensive periorbital edema is present. The globe and orbit are intact.  Patient is edentulous.  The mandible is intact and located.  CT CERVICAL SPINE FINDINGS  The cervical spine is imaged from the skullbase through T3. There is significant loss of disc height and endplate osteophyte formation throughout the cervical spine. This is generally worse on the right. No acute fracture or traumatic subluxation is evident. The lung apices are clear. The soft tissues of the neck are unremarkable. The endotracheal tube is in satisfactory position.  IMPRESSION: 1. 2.3 x 1.2 x 1.8 cm hemorrhagic infarct within the upper pons and lower midbrain. 2. Extensive hypoattenuation in the midbrain superior to this area compatible with edema or additional nonhemorrhagic infarct. 3. Multiple acute and subacute nonhemorrhagic infarcts involving the cerebellum and thalami bilaterally as described. These represent multiple vascular lesions within the posterior circulation territory. 4. Atrophy and white matter disease within the anterior circulation territories is within normal limits for age. 5. Extensive soft tissue swelling over the right side of the scalp and face. There is no underlying abscess or osseous lesion. This may represent positional edema if the patient was down for an extended. 6. Moderate spondylosis of the cervical spine. No acute abnormality of spine. Critical Value/emergent results were called by telephone at the time of interpretation on 04/28/2014 at 12:24 PM to Dr. Blake DivineJOHN Breydan [place] , who verbally acknowledged these results.   Electronically Signed   By: Thayer Ohmhris  Mattern M.D.   On: 04/08/2014 12:30   Ct Cervical Spine Wo Contrast  04/22/2014   CLINICAL DATA:  Patient found down unresponsive. Contusion to back of head. Lasting normal several days ago.  EXAM: CT HEAD WITHOUT CONTRAST  CT MAXILLOFACIAL WITHOUT CONTRAST  CT CERVICAL SPINE WITHOUT CONTRAST  TECHNIQUE: Multidetector CT imaging  of the head, cervical spine, and maxillofacial structures were performed using the standard protocol without intravenous contrast. Multiplanar CT image reconstructions of the cervical spine and maxillofacial structures were also generated.  COMPARISON:  CT head without contrast 01/03/2013. CT cervical spine 07/05/2010  FINDINGS: CT HEAD FINDINGS  Patient's intubated. A brainstem hemorrhage is evident within the pons posteriorly and to the right measuring at least 2.3 x 1.2 x 1.8cm. mm there is marked hypoattenuation in the midbrain above the hemorrhage suggesting infarct. Multiple patchy areas of hypoattenuation are present within the cerebellar hemispheres bilaterally, more prominent left than right. No additional hemorrhage is present. 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  A fluid level is present in the right frontal sinus. There scattered opacification of ethmoid air cells. Circumferential mucosal thickening in some fluid is present in the right maxillary sinus. Mild mucosal thickening is present in the right sphenoid sinus. There is fluid in the left mastoid air cells. The right mastoid air cells are clear. Extensive edema is present over the right side of the head including periorbital edema. This extends into the face.  CT MAXILLOFACIAL FINDINGS  Extensive soft tissue swelling is present over the right side of skull and face. There is no discrete abscess. Asymmetric mucosal disease in fluid is present within the paranasal sinuses. The patient is intubated. Extensive periorbital edema is present. The globe and orbit are intact.  Patient is edentulous.  The mandible is intact and located.  CT CERVICAL SPINE FINDINGS  The cervical spine is imaged from the skullbase through T3. There is significant loss of disc height and endplate  osteophyte formation throughout the cervical spine. This is generally worse on the right. No acute fracture or traumatic subluxation is evident. The lung apices are clear. The soft tissues of the neck are unremarkable. The endotracheal tube is in satisfactory position.  IMPRESSION: 1. 2.3 x 1.2 x 1.8 cm hemorrhagic infarct within the upper pons and lower midbrain. 2. Extensive hypoattenuation in the midbrain superior to this area compatible with edema or additional nonhemorrhagic infarct. 3. Multiple acute and subacute nonhemorrhagic infarcts involving the cerebellum and thalami bilaterally as described. These represent multiple vascular lesions within the posterior circulation territory. 4. Atrophy and white matter disease within the anterior circulation territories is within normal limits for age. 5. Extensive soft tissue swelling over the right side of the scalp and face. There is no underlying abscess or osseous lesion. This may represent positional edema if the patient was down for an extended. 6. Moderate spondylosis of the cervical spine. No acute abnormality of spine. Critical Value/emergent results were called by telephone at the time of interpretation on 05/01/2014 at 12:24 PM to Dr. Blake Divine , who verbally acknowledged these results.   Electronically Signed   By: Gennette Pac M.D.   On: 05/04/2014 12:30   Dg Chest Port 1 View  05/01/2014   CLINICAL DATA:  unresponsive  EXAM: PORTABLE CHEST - 1 VIEW  COMPARISON:  CT Chest abdomen pelvis 07/05/2010.  FINDINGS: The heart size  and mediastinal contours are within normal limits. 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 Both lungs are otherwise clear. The lungs are hyperinflated. The visualized skeletal structures are unremarkable.  IMPRESSION: COPD.  No acute cardiopulmonary disease.   Electronically Signed   By: Salome Holmes M.D.   On: 04/21/2014 12:18   Ct Maxillofacial  Wo Cm  04/21/2014   CLINICAL DATA:  Patient found down unresponsive. Contusion to back of head. Lasting normal several days ago.  EXAM: CT HEAD WITHOUT CONTRAST  CT MAXILLOFACIAL WITHOUT CONTRAST  CT CERVICAL SPINE WITHOUT CONTRAST  TECHNIQUE: Multidetector CT imaging of the head, cervical spine, and maxillofacial structures were performed using the standard protocol without intravenous contrast. Multiplanar CT image reconstructions of the cervical spine and maxillofacial structures were also generated.  COMPARISON:  CT head without contrast 01/03/2013. CT cervical spine 07/05/2010  FINDINGS: CT HEAD FINDINGS  Patient's intubated. A brainstem hemorrhage is evident within the pons posteriorly and to the right measuring at least 2.3 x 1.2 x 1.8cm. mm there is marked hypoattenuation in the midbrain above the hemorrhage suggesting infarct. Multiple patchy areas of hypoattenuation are present within the cerebellar hemispheres bilaterally, more prominent left than right. No additional hemorrhage is present. 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  A fluid level is present in the right frontal sinus. There scattered opacification of ethmoid air cells. Circumferential mucosal thickening in some fluid is present in the right maxillary sinus. Mild mucosal thickening is present in the right sphenoid sinus. There is fluid in the left mastoid air cells. The right mastoid air cells are clear. Extensive edema is present over the right side of the head including periorbital edema. This extends into the face.  CT MAXILLOFACIAL FINDINGS  Extensive soft tissue swelling is present over the right side of skull and face. There is no discrete abscess. Asymmetric mucosal disease in fluid is present within the paranasal sinuses. The patient is intubated. Extensive  periorbital edema is present. The globe and orbit are intact.  Patient is edentulous.  The mandible is intact and located.  CT CERVICAL SPINE FINDINGS  The cervical spine is imaged from the skullbase through T3. There is significant loss of disc height and endplate osteophyte formation throughout the cervical spine. This is generally worse on the right. No acute fracture or traumatic subluxation is evident. The lung apices are clear. The soft tissues of the neck are unremarkable. The endotracheal tube is in satisfactory position.  IMPRESSION: 1. 2.3 x 1.2 x 1.8 cm hemorrhagic infarct within the upper pons and lower midbrain. 2. Extensive hypoattenuation in the midbrain superior to this area compatible with edema or additional nonhemorrhagic infarct. 3. Multiple acute and subacute nonhemorrhagic infarcts involving the cerebellum and thalami bilaterally as described. These represent multiple vascular lesions within the posterior circulation territory. 4. Atrophy and white matter disease within the anterior circulation territories is within normal limits for age. 5. Extensive soft tissue swelling over the right side of the scalp and face. There is no underlying abscess or osseous lesion. This may represent positional edema if the patient was down for an extended. 6. Moderate spondylosis of the cervical spine. No acute abnormality of spine. Critical Value/emergent results were called by telephone at the time of interpretation on 04/27/2014 at 12:24 PM to Dr. Blake Divine ,  who verbally acknowledged these results.   Electronically Signed   By: Gennette Pachris  Mattern M.D.   On: 03-25-2014 12:30     EKG Interpretation   Date/Time:  Saturday April 11 2014 11:15:02 EDT Ventricular Rate:  166 PR Interval:    QRS Duration: 151 QT Interval:  320 QTC Calculation: 532 R Axis:   90 Text Interpretation:  Extreme tachycardia with wide complex, no further  rhythm analysis attempted compared to prior, rate dramatically increased,   rhythm irregular Confirmed by [facility]  MD, TREY ([id number]) on 04/27/2014 4:26:41  PM      MDM   Final diagnoses:  Unresponsive  CVA (cerebral vascular accident)  Multiple organ system failure  Non-traumatic rhabdomyolysis    68 year old male found unresponsive at his home. Intubated in the field. Imaging shows multiple cerebral, brainstem, and cerebellar infarcts with some hemorrhagic conversion.  He also has evidence of liver, cardiac, and kidney dysfunction.  EKG showed initially very tachycardic rate with irregular rhythm. Rate improved with IV fluid resuscitation and appeared to be a fib on the monitor. Admitted to the ICU.    Candyce ChurnJohn David Yunique Dearcos III, MD 2014/02/15 [phone]

## 2014-04-11 NOTE — Progress Notes (Signed)
05/02/2014 1600  Clinical Encounter Type  Visited With Patient;Family;Health care provider  Visit Type Initial;Spiritual support;Social support;ED;Patient actively dying  Referral From Nurse  Spiritual Encounters  Spiritual Needs Emotional;Grief support  Stress Factors  Patient Stress Factors None identified  Family Stress Factors None identified  Chaplain Note: Chaplain responded to patient in [facility]. HCP reported patient to have been intubated, having suffered from a stroke. HCP reports that the location of the stroke would make it difficult for the patient to survive. HCP discussed withdrawal with the family, and family agreed. One member of the family was a former employee of [employer], which was where patient was later transferred. After speaking with HCP again, family agreed to extubate patient and allow him a peaceful death. Renato Gailsastor was present with family throughout the majority of the day and offered prayer for the patient and family. Chaplain offered emotional and grief support.  Toni AmendAndria Williamson, Chaplain

## 2014-04-11 NOTE — Progress Notes (Signed)
Transported to CT on vent with RT and returned to ED room.

## 2014-04-11 NOTE — ED Notes (Signed)
Per EMS pt found at home unresponsive with dark blood from mouth and all over floor. Patient has small contusion to the back of his head. Pt. Last seen Thursday. Pt initial BP 50/30/ pt has had appx 800ml of fluid. Pt initially in a.fib placed in reverse trend. And converted to sinus tach rate between 140-160bpm. Pt intubated on scene 7.0 and BVM

## 2014-04-16 NOTE — Discharge Summary (Signed)
NAMTheola Sequin:  Heizer, Deanna             ACCOUNT NO.:  [id number]  MEDICAL RECORD NO.:  [id number]  LOCATION:  2M05C                        FACILITY:  [facility]  PHYSICIAN:  Veto KempsUGLAS BRENT MCQUAID, MDDATE OF BIRTH:  06-28-1946  DATE OF ADMISSION:  08-07-2014 DATE OF DISCHARGE:  08-07-2014                              DISCHARGE SUMMARY   DEATH SUMMARY  DATE OF DEATH:  April 11, 2014.  CAUSE OF DEATH:  Ischemic infarct in the pons and midbrain with hemorrhagic conversion.  HISTORY OF PRESENT ILLNESS:  This is a [age] year old male with minimal past medical history, who [facility], 2014, [facility] after he was found down at home.  He was found to have a mid brain stroke with hemorrhagic conversion in the emergency department. The patient was intubated in the ED and no further history could be obtained, from talking to the ED providers and the patient's family. The patient was last seen normal on April 09, 2014, in the afternoon when he was playing with children near his home.  No one heard him on April 10, 2014, so family went to check on him on April 11, 2014.  He was found by his brother face down under his bed, breathing spontaneously, but unresponsive.  [facility] who [facility] the patient en route.  Past medical history, family history, social history, review of systems, see the admission H and P.  PHYSICAL EXAMINATION:  VITAL SIGNS:  On admission, __________ 95.7, heart rate 133, respirations 22, blood pressure 131/74, SpO2 is 99% on the ventilator. GENERAL:  Elderly white male, multiple pressure wounds, chronically appearing on vent. HEENT:  Right-sided pressure wound/ulcer over his right zygomatic process, right eyelid erythematous, fluctuant, left pupil round, not reactive to light.  ET tube is in place.  Hard collar is in  place. PULMONARY:  Rhonchi bilaterally good air movement. CARDIOVASCULAR:  Irregularly irregular.  No murmurs,  gallops, or rubs. ABDOMEN:  Bowel sounds are positive.  Soft, nontender.  No hepatosplenomegaly. EXTREMITIES:  Warm.  No edema.  No clubbing, no cyanosis. DERM:  Multiple skin ulcerations, a large chest wall, pressure ulcer, bilateral knee pressure ulcers, left greater than right. NEUROLOGIC:  Non-arousable on vent, nonpurposeful movements in the left arm and leg.  HOSPITAL COURSE:  Acute ischemic stroke of the pons and midbrain with hemorrhagic conversion.  The patient had been intubated in the emergency department for airway protection.  We reviewed the CT with Neurology who felt that this was a stroke which was not compatible with any sort of neurologic recovery.  We discussed this with the patient's family, and they felt it would be appropriate to remove the ventilator as there was no hope for meaningful neurologic recovery. Multiple family members came and we discussed this at length.  He was extubated on April 11, 2014, on a morphine drip with family at the bedside and he died peacefully.          ______________________________ Veto KempsUGLAS BRENT MCQUAID, MD     DBM/MEDQ  D:  04/15/2014  T:  04/16/2014  Job:  [id number]

## 2014-05-09 DEATH — deceased

## 2015-07-25 IMAGING — CT CT CERVICAL SPINE W/O CM
5 of 10 series · 12 of 33 positions shown, 13 images · non-contrast
Comparison: CT head without contrast 01/03/2013. CT cervical spine
07/05/2010

CLINICAL DATA: Patient found down unresponsive. Contusion to back
of head. Lasting normal several days ago.

EXAM:
CT HEAD WITHOUT CONTRAST
CT MAXILLOFACIAL WITHOUT CONTRAST
CT CERVICAL SPINE WITHOUT CONTRAST
TECHNIQUE: Multidetector CT imaging of the head, cervical spine, and
maxillofacial structures were performed using the standard protocol
without intravenous contrast. Multiplanar CT image reconstructions
of the cervical spine and maxillofacial structures were also
generated.

[Series 4: head 2.0 h70h · axial · 0.50mm/px · z∈[-110,-50]mm · 2 of 92 slices shown]
[im 31/92  bone]
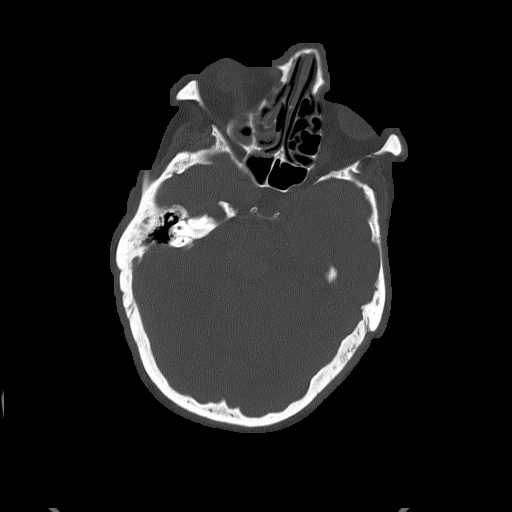
[im 61/92  bone]
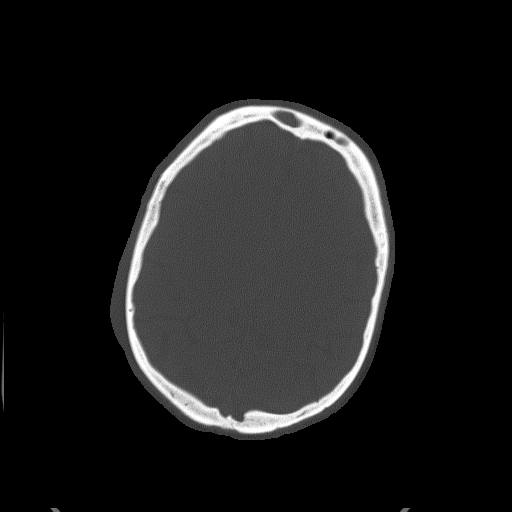

[Series 5: facial/ orbits 2.0 h30s · axial · 0.42mm/px · z∈[-189,-125]mm · 2 of 98 slices shown, 3 images]
[im 33/98  soft-tissue]
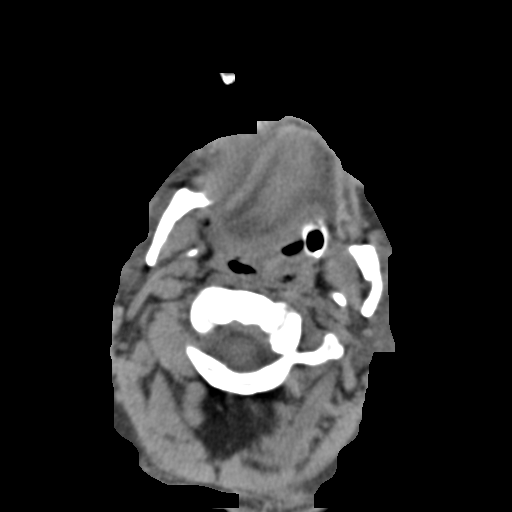
[im 33/98  bone]
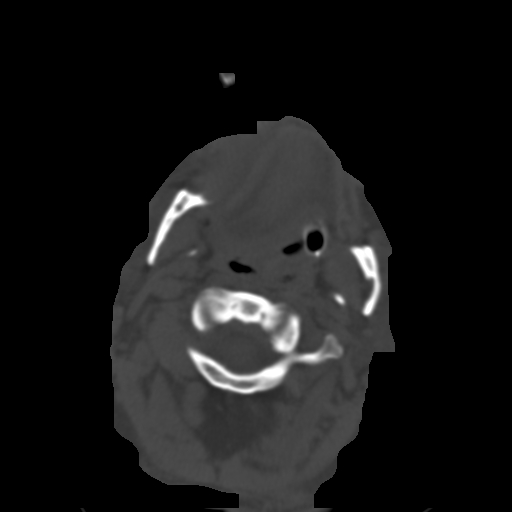
[im 65/98  bone]
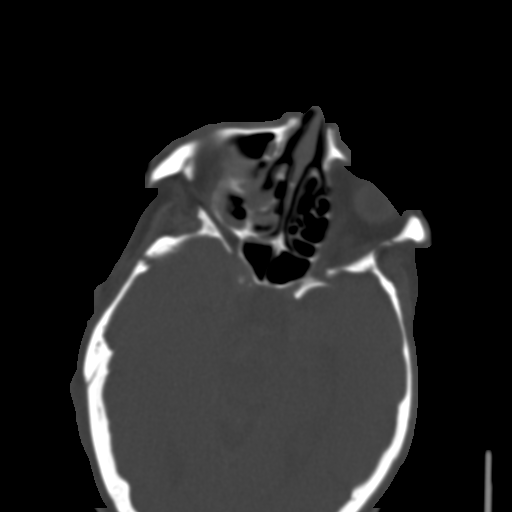

[Series 10: c_spine 2.0 i40s 3 · axial · 0.30mm/px · z∈[-271,-211]mm · 2 of 92 slices shown]
[im 31/92  bone]
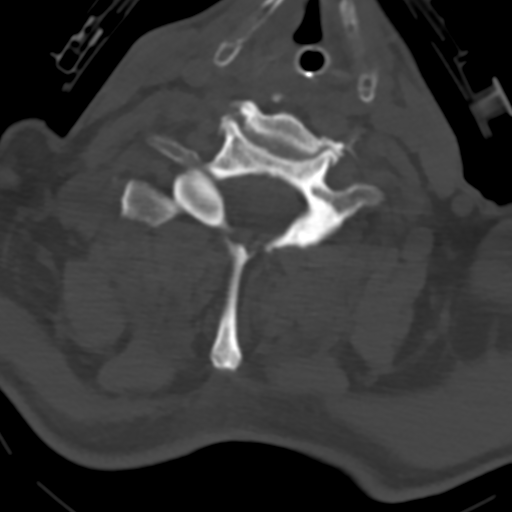
[im 61/92  bone]
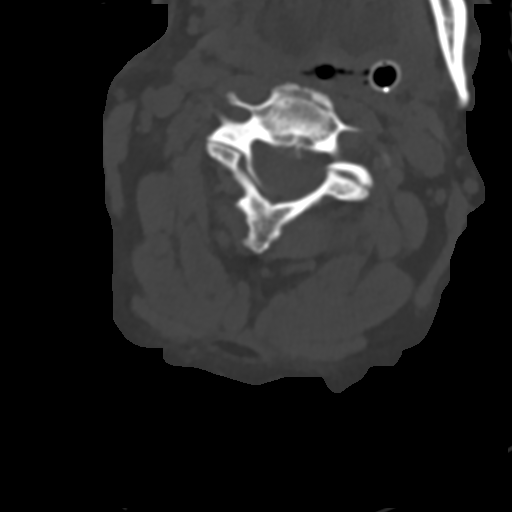

[Series 602: st cor · coronal · 0.42mm/px · 1 of 90 slices shown]
[im 45/90  bone]
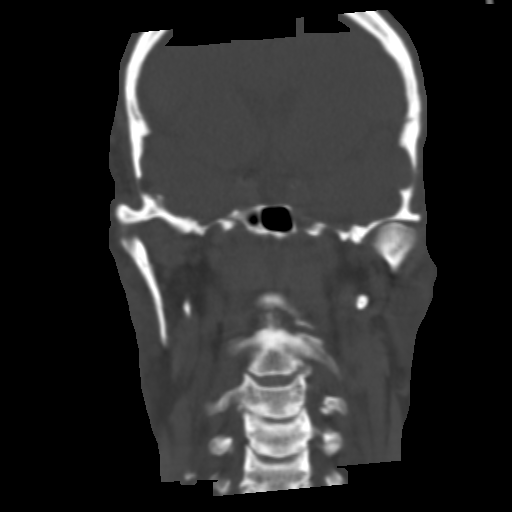

[Series 603: st sag · sagittal · 0.42mm/px · 5 of 81 slices shown]
[im 14/81  bone]
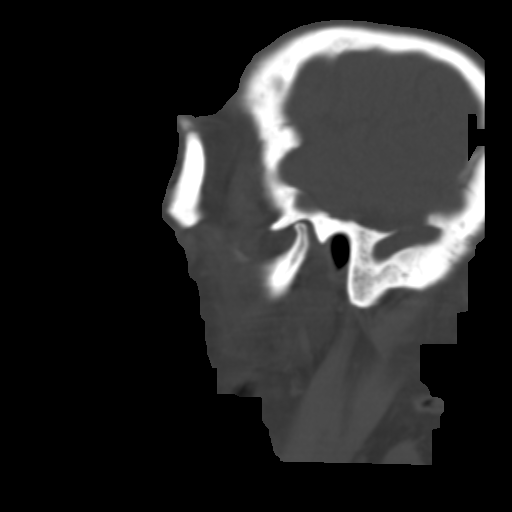
[im 27/81  bone]
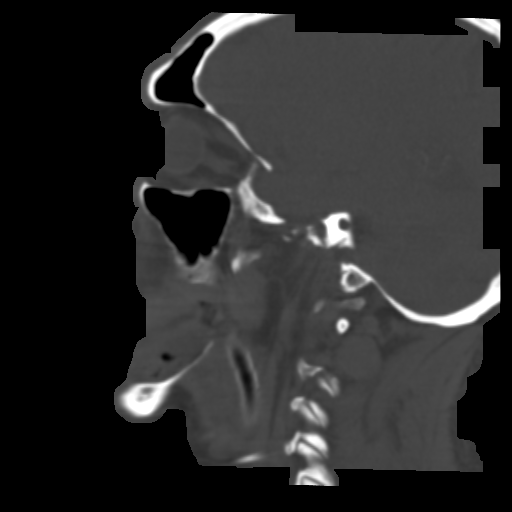
[im 41/81  bone]
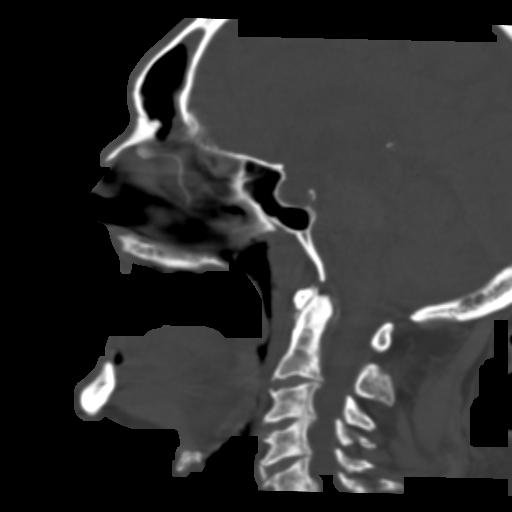
[im 54/81  bone]
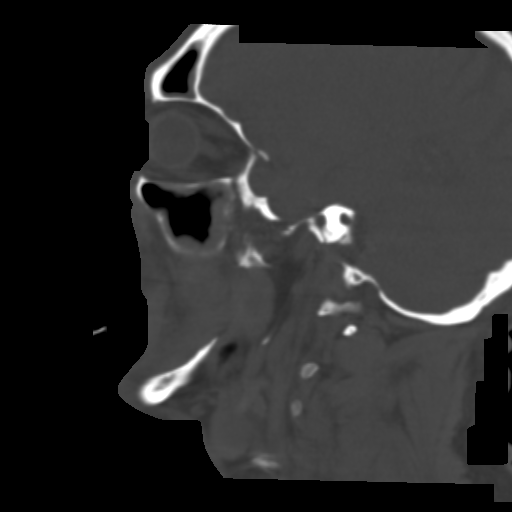
[im 67/81  bone]
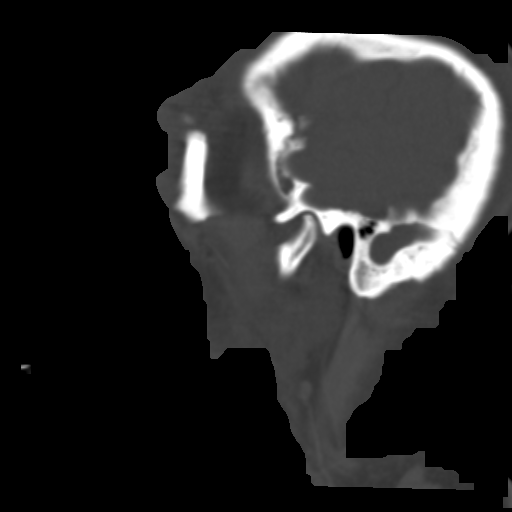

[12 of 33 positions shown; findings below may reference images not displayed]

FINDINGS: CT HEAD FINDINGS

Patient's intubated. A brainstem hemorrhage is evident within the
pons posteriorly and to the right measuring at least 2.3 x 1.2 x
1.8cm. mm there is marked hypoattenuation in the midbrain above the
hemorrhage suggesting infarct. Multiple patchy areas of
hypoattenuation are present within the cerebellar hemispheres
bilaterally, more prominent left than right. No additional
hemorrhage is present. Bilateral nonhemorrhagic thalamic infarcts
are present, worse on the right

Mild generalized atrophy and white matter disease is present without
other significant cortical infarcts within the anterior circulation.

A fluid level is present in the right frontal sinus. There scattered
opacification of ethmoid air cells. Circumferential mucosal
thickening in some fluid is present in the right maxillary sinus.
Mild mucosal thickening is present in the right sphenoid sinus.
There is fluid in the left mastoid air cells. The right mastoid air
cells are clear. Extensive edema is present over the right side of
the head including periorbital edema. This extends into the face.

CT MAXILLOFACIAL FINDINGS

Extensive soft tissue swelling is present over the right side of
skull and face. There is no discrete abscess. Asymmetric mucosal
disease in fluid is present within the paranasal sinuses. The
patient is intubated. Extensive periorbital edema is present. The
globe and orbit are intact.

Patient is edentulous.  The mandible is intact and located.

CT CERVICAL SPINE FINDINGS

The cervical spine is imaged from the skullbase through T3. There is
significant loss of disc height and endplate osteophyte formation
throughout the cervical spine. This is generally worse on the right.
No acute fracture or traumatic subluxation is evident. The lung
apices are clear. The soft tissues of the neck are unremarkable. The
endotracheal tube is in satisfactory position.
IMPRESSION: 1. 2.3 x 1.2 x 1.8 cm hemorrhagic infarct within the upper pons and
lower midbrain.
2. Extensive hypoattenuation in the midbrain superior to this area
compatible with edema or additional nonhemorrhagic infarct.
3. Multiple acute and subacute nonhemorrhagic infarcts involving the
cerebellum and thalami bilaterally as described. These represent
multiple vascular lesions within the posterior circulation
territory.
4. Atrophy and white matter disease within the anterior circulation
territories is within normal limits for age.
5. Extensive soft tissue swelling over the right side of the scalp
and face. There is no underlying abscess or osseous lesion. This may
represent positional edema if the patient was down for an extended.
6. Moderate spondylosis of the cervical spine. No acute abnormality
of spine.
Critical Value/emergent results were called by telephone at the time
of interpretation on 04/11/2014 at [DATE] to Dr. JOSE ANJEL MENEZ , who
verbally acknowledged these results.
